# Patient Record
Sex: Female | Born: 1985 | Race: White | Marital: Single | Smoking: Never smoker
Health system: Southern US, Community
[De-identification: ages and names within clinical notes are randomized; demographics above are authoritative.]

## PROBLEM LIST (undated history)

## (undated) DIAGNOSIS — T7840XA Allergy, unspecified, initial encounter: Secondary | ICD-10-CM

## (undated) DIAGNOSIS — G43909 Migraine, unspecified, not intractable, without status migrainosus: Secondary | ICD-10-CM

## (undated) DIAGNOSIS — J45909 Unspecified asthma, uncomplicated: Secondary | ICD-10-CM

## (undated) HISTORY — DX: Migraine, unspecified, not intractable, without status migrainosus: G43.909

## (undated) HISTORY — DX: Allergy, unspecified, initial encounter: T78.40XA

## (undated) HISTORY — DX: Unspecified asthma, uncomplicated: J45.909

---

## 2015-03-04 ENCOUNTER — Ambulatory Visit (INDEPENDENT_AMBULATORY_CARE_PROVIDER_SITE_OTHER): Payer: 59 | Admitting: Physician Assistant

## 2015-03-04 ENCOUNTER — Encounter: Payer: Self-pay | Admitting: Physician Assistant

## 2015-03-04 VITALS — BP 110/70 | HR 85 | Temp 98.1°F | Resp 16 | Ht 67.5 in | Wt 150.0 lb

## 2015-03-04 DIAGNOSIS — J45909 Unspecified asthma, uncomplicated: Secondary | ICD-10-CM | POA: Insufficient documentation

## 2015-03-04 DIAGNOSIS — R5383 Other fatigue: Secondary | ICD-10-CM | POA: Diagnosis not present

## 2015-03-04 DIAGNOSIS — Z79899 Other long term (current) drug therapy: Secondary | ICD-10-CM

## 2015-03-04 DIAGNOSIS — F411 Generalized anxiety disorder: Secondary | ICD-10-CM | POA: Diagnosis not present

## 2015-03-04 DIAGNOSIS — E559 Vitamin D deficiency, unspecified: Secondary | ICD-10-CM | POA: Diagnosis not present

## 2015-03-04 DIAGNOSIS — G43909 Migraine, unspecified, not intractable, without status migrainosus: Secondary | ICD-10-CM | POA: Insufficient documentation

## 2015-03-04 DIAGNOSIS — R002 Palpitations: Secondary | ICD-10-CM

## 2015-03-04 DIAGNOSIS — T7840XA Allergy, unspecified, initial encounter: Secondary | ICD-10-CM | POA: Insufficient documentation

## 2015-03-04 LAB — HEPATIC FUNCTION PANEL
ALBUMIN: 4.9 g/dL (ref 3.6–5.1)
ALT: 19 U/L (ref 6–29)
AST: 20 U/L (ref 10–30)
Alkaline Phosphatase: 51 U/L (ref 33–115)
BILIRUBIN TOTAL: 0.7 mg/dL (ref 0.2–1.2)
Bilirubin, Direct: 0.1 mg/dL (ref <=0.2)
Indirect Bilirubin: 0.6 mg/dL (ref 0.2–1.2)
TOTAL PROTEIN: 7.1 g/dL (ref 6.1–8.1)

## 2015-03-04 LAB — CBC WITH DIFFERENTIAL/PLATELET
BASOS ABS: 0 10*3/uL (ref 0.0–0.1)
Basophils Relative: 0 % (ref 0–1)
EOS ABS: 0.3 10*3/uL (ref 0.0–0.7)
EOS PCT: 3 % (ref 0–5)
HEMATOCRIT: 42.6 % (ref 36.0–46.0)
Hemoglobin: 14.4 g/dL (ref 12.0–15.0)
LYMPHS ABS: 2.2 10*3/uL (ref 0.7–4.0)
LYMPHS PCT: 22 % (ref 12–46)
MCH: 29.9 pg (ref 26.0–34.0)
MCHC: 33.8 g/dL (ref 30.0–36.0)
MCV: 88.6 fL (ref 78.0–100.0)
MONO ABS: 0.5 10*3/uL (ref 0.1–1.0)
MPV: 11 fL (ref 8.6–12.4)
Monocytes Relative: 5 % (ref 3–12)
Neutro Abs: 6.9 10*3/uL (ref 1.7–7.7)
Neutrophils Relative %: 70 % (ref 43–77)
PLATELETS: 251 10*3/uL (ref 150–400)
RBC: 4.81 MIL/uL (ref 3.87–5.11)
RDW: 12.4 % (ref 11.5–15.5)
WBC: 9.9 10*3/uL (ref 4.0–10.5)

## 2015-03-04 LAB — BASIC METABOLIC PANEL WITH GFR
BUN: 9 mg/dL (ref 7–25)
CALCIUM: 9.7 mg/dL (ref 8.6–10.2)
CO2: 23 mmol/L (ref 20–31)
CREATININE: 0.69 mg/dL (ref 0.50–1.10)
Chloride: 105 mmol/L (ref 98–110)
GFR, Est Non African American: >89 mL/min (ref >=60)
GLUCOSE: 85 mg/dL (ref 65–99)
POTASSIUM: 3.9 mmol/L (ref 3.5–5.3)
Sodium: 138 mmol/L (ref 135–146)

## 2015-03-04 LAB — IRON AND TIBC
%SAT: 54 % — ABNORMAL HIGH (ref 11–50)
Iron: 212 ug/dL — ABNORMAL HIGH (ref 40–190)
TIBC: 391 ug/dL (ref 250–450)
UIBC: 179 ug/dL (ref 125–400)

## 2015-03-04 LAB — MAGNESIUM: MAGNESIUM: 2 mg/dL (ref 1.5–2.5)

## 2015-03-04 LAB — TSH: TSH: 0.467 u[IU]/mL (ref 0.350–4.500)

## 2015-03-04 LAB — VITAMIN B12: VITAMIN B 12: 496 pg/mL (ref 211–911)

## 2015-03-04 LAB — FERRITIN: FERRITIN: 32 ng/mL (ref 10–291)

## 2015-03-04 MED ORDER — VERAPAMIL HCL ER 120 MG PO TBCR: 120.0000 mg | EXTENDED_RELEASE_TABLET | Freq: Every day | ORAL | Status: DC

## 2015-03-04 NOTE — Progress Notes (Signed)
Assessment and Plan: 1. Palpitations Normal EKG, nonexertional Likely anxiety versus SVT, will check labs rule out anemia, thyroid, etc.  Taught valsalva, start verapamil 120mg  at night, may need to increase. Due to lower blood pressure will avoid BB at this time.  - EKG 12-Lead - TSH - Iron and TIBC - Ferritin  2. Generalized anxiety disorder Will start verapamil for now, discussed SSRIs however patient is not interested at this time.  Will do close follow up. No SI/HI.   3. Other fatigue Check labs, EKG normal, ? Anxiety/depression component.  - CBC with Differential/Platelet - BASIC METABOLIC PANEL WITH GFR - Hepatic function panel - Iron and TIBC - Ferritin - Vitamin B12  4. Medication management - Magnesium  5. Vitamin D deficiency - Vit D  25 hydroxy (rtn osteoporosis monitoring)    HPI 29 y.o.female presents as new patient, has not had insurance for several years, [facility]. From [place], works with as Sales executivedental assistant. She has asthma, gets albuterol from her mother in law for asthma.   She has always been shy, but recently in the last years she will get very anxious and will have palpitations, shortness of breath, voice is shakey, eyes watery and what is bothering her is that she will have shaking as a Sales executivedental assistant. She has some decreased concentration issues, has trouble sleeping due to fast heart beat. She will cry occasionally from being overwhelmed. She exercises, runs without CP, SOB, palpitations. Moved a year and a half ago, new job 3 months ago.   Never abnormal pap, last one April 2015. She has regular periods but has been getting shorter, not as heavy.   Past Medical History  Diagnosis Date  . Asthma   . Allergy   . Migraines      Allergies  Allergen Reactions  . Soy Allergy Swelling     No current outpatient prescriptions on file prior to visit.   No current facility-administered medications on file prior to visit.    Allergies Allergies  Allergen Reactions  . Soy Allergy Swelling   Surgical history History reviewed. No pertinent past surgical history. Family history History reviewed. No pertinent family history.  ROS: all negative except above.   Physical Exam: Filed Weights   03/04/15 1130  Weight: 150 lb (68.04 kg)   BP 110/70 mmHg  Pulse 85  Temp(Src) 98.1 F (36.7 C) (Temporal)  Resp 16  Ht 5' 7.5" (1.715 m)  Wt 150 lb (68.04 kg)  BMI 23.13 kg/m2  SpO2 99%  LMP 02/16/2015 General Appearance: Well nourished, in no apparent distress. Eyes: PERRLA, EOMs, conjunctiva no swelling or erythema Sinuses: No Frontal/maxillary tenderness ENT/Mouth: Ext aud canals clear, TMs without erythema, bulging. No erythema, swelling, or exudate on post pharynx.  Tonsils not swollen or erythematous. Hearing normal.  Neck: Supple, thyroid normal.  Respiratory: Respiratory effort normal, BS equal bilaterally without rales, rhonchi, wheezing or stridor.  Cardio: RRR with no MRGs. Brisk peripheral pulses without edema.  Abdomen: Soft, + BS.  Non tender, no guarding, rebound, hernias, masses. Lymphatics: Non tender without lymphadenopathy.  Musculoskeletal: Full ROM, 5/5 strength, normal gait.  Skin: Warm, dry without rashes, lesions, ecchymosis.  Neuro: Cranial nerves intact. Normal muscle tone, no cerebellar symptoms. Sensation intact.  Psych: Awake and oriented X 3, normal affect, Insight and Judgment appropriate.    EKG: WNL, no ST changes.   Quentin MullingAmanda Kalief Kattner, PA-C 12:07 PM [facility]

## 2015-03-04 NOTE — Patient Instructions (Addendum)
Palpitations A palpitation is the feeling that your heartbeat is irregular or is faster than normal. It may feel like your heart is fluttering or skipping a beat. Palpitations are usually not a serious problem. However, in some cases, you may need further medical evaluation. CAUSES  Palpitations can be caused by:  Smoking.  Caffeine or other stimulants, such as diet pills or energy drinks.  Alcohol.  Stress and anxiety.  Strenuous physical activity.  Fatigue.  Certain medicines.  Heart disease, especially if you have a history of irregular heart rhythms (arrhythmias), such as atrial fibrillation, atrial flutter, or supraventricular tachycardia.  An improperly working pacemaker or defibrillator. DIAGNOSIS  To find the cause of your palpitations, your health care provider will take your medical history and perform a physical exam. Your health care provider may also have you take a test called an ambulatory electrocardiogram (ECG). An ECG records your heartbeat patterns over a 24-hour period. You may also have other tests, such as:  Transthoracic echocardiogram (TTE). During echocardiography, sound waves are used to evaluate how blood flows through your heart.  Transesophageal echocardiogram (TEE).  Cardiac monitoring. This allows your health care provider to monitor your heart rate and rhythm in real time.  Holter monitor. This is a portable device that records your heartbeat and can help diagnose heart arrhythmias. It allows your health care provider to track your heart activity for several days, if needed.  Stress tests by exercise or by giving medicine that makes the heart beat faster. TREATMENT  Treatment of palpitations depends on the cause of your symptoms and can vary greatly. Most cases of palpitations do not require any treatment other than time, relaxation, and monitoring your symptoms. Other causes, such as atrial fibrillation, atrial flutter, or supraventricular  tachycardia, usually require further treatment. HOME CARE INSTRUCTIONS   Avoid:  Caffeinated coffee, tea, soft drinks, diet pills, and energy drinks.  Chocolate.  Alcohol.  Stop smoking if you smoke.  Reduce your stress and anxiety. Things that can help you relax include:  A method of controlling things in your body, such as your heartbeats, with your mind (biofeedback).  Yoga.  Meditation.  Physical activity such as swimming, jogging, or walking.  Get plenty of rest and sleep. SEEK MEDICAL CARE IF:   You continue to have a fast or irregular heartbeat beyond 24 hours.  Your palpitations occur more often. SEEK IMMEDIATE MEDICAL CARE IF:  You have chest pain or shortness of breath.  You have a severe headache.  You feel dizzy or you faint. MAKE SURE YOU:  Understand these instructions.  Will watch your condition.  Will get help right away if you are not doing well or get worse.   This information is not intended to replace advice given to you by your health care provider. Make sure you discuss any questions you have with your health care provider.   Document Released: 04/06/2000 Document Revised: 04/14/2013 Document Reviewed: 06/08/2011 Elsevier Interactive Patient Education [employer]2016 [employer]  Your ears and sinuses are connected by the eustachian tube. When your sinuses are inflamed, this can close off the tube and cause fluid to collect in your middle ear. This can then cause dizziness, popping, clicking, ringing, and echoing in your ears. This is often NOT an infection and does NOT require antibiotics, it is caused by inflammation so the treatments help the inflammation. This can take a long time to get better so please be patient.  Here are things you can do to help with  this: - Try the Flonase or Nasonex. Remember to spray each nostril twice towards the outer part of your eye.  Do not sniff but instead pinch your nose and tilt your head back to help the medicine  get into your sinuses.  The best time to do this is at bedtime.Stop if you get blurred vision or nose bleeds.  -While drinking fluids, pinch and hold nose close and swallow, to help open eustachian tubes to drain fluid behind ear drums. -Please pick one of the over the counter allergy medications below and take it once daily for allergies.  It will also help with fluid behind ear drums. Claritin or loratadine cheapest but likely the weakest  Zyrtec or certizine at night because it can make you sleepy The strongest is allegra or fexafinadine  Cheapest at [facility], [employer], [employer] -can use decongestant over the counter, please do not use if you have high blood pressure or certain heart conditions.   if worsening HA, changes vision/speech, imbalance, weakness go to the ER

## 2015-03-05 LAB — VITAMIN D 25 HYDROXY (VIT D DEFICIENCY, FRACTURES): VIT D 25 HYDROXY: 29 ng/mL — AB (ref 30–100)

## 2015-03-06 ENCOUNTER — Encounter: Payer: Self-pay | Admitting: Physician Assistant

## 2015-03-07 ENCOUNTER — Other Ambulatory Visit: Payer: Self-pay | Admitting: Physician Assistant

## 2015-03-07 ENCOUNTER — Encounter: Payer: Self-pay | Admitting: Physician Assistant

## 2015-03-07 MED ORDER — HEPATITIS B VAC RECOMBINANT 10 MCG/0.5ML IM INJ: 0.5000 mL | INJECTION | Freq: Once | INTRAMUSCULAR | Status: DC

## 2015-03-15 MED ORDER — HEPATITIS B VAC RECOMBINANT 10 MCG/0.5ML IM INJ: 0.5000 mL | INJECTION | Freq: Once | INTRAMUSCULAR | Status: DC

## 2015-04-01 ENCOUNTER — Ambulatory Visit: Payer: Self-pay | Admitting: Physician Assistant

## 2015-04-08 ENCOUNTER — Encounter: Payer: Self-pay | Admitting: Physician Assistant

## 2015-04-08 ENCOUNTER — Ambulatory Visit (INDEPENDENT_AMBULATORY_CARE_PROVIDER_SITE_OTHER): Payer: 59 | Admitting: Physician Assistant

## 2015-04-08 ENCOUNTER — Other Ambulatory Visit: Payer: Self-pay

## 2015-04-08 VITALS — BP 118/64 | HR 87 | Temp 97.9°F | Resp 16 | Ht 67.5 in | Wt 156.0 lb

## 2015-04-08 DIAGNOSIS — R002 Palpitations: Secondary | ICD-10-CM

## 2015-04-08 DIAGNOSIS — F411 Generalized anxiety disorder: Secondary | ICD-10-CM

## 2015-04-08 MED ORDER — ALPRAZOLAM 0.5 MG PO TABS: ORAL_TABLET | ORAL | Status: DC

## 2015-04-08 MED ORDER — SERTRALINE HCL 25 MG PO TABS: 25.0000 mg | ORAL_TABLET | Freq: Every day | ORAL | Status: DC

## 2015-04-08 NOTE — Patient Instructions (Signed)
Take zoloft at night, may increase to 50mg  at end of month or keep the same  Take the xanax AS NEEDED for panic attack, try 1/2-1 pill   Generalized Anxiety Disorder Generalized anxiety disorder (GAD) is a mental disorder. It interferes with life functions, including relationships, work, and school. GAD is different from normal anxiety, which everyone experiences at some point in their lives in response to specific life events and activities. Normal anxiety actually helps us prepare for and get through these life events and activities. Normal anxiety goes away after the event or activity is over.  GAD causes anxiety that is not necessarily related to specific events or activities. It also causes excess anxiety in proportion to specific events or activities. The anxiety associated with GAD is also difficult to control. GAD can vary from mild to severe. People with severe GAD can have intense waves of anxiety with physical symptoms (panic attacks).  SYMPTOMS The anxiety and worry associated with GAD are difficult to control. This anxiety and worry are related to many life events and activities and also occur more days than not for 6 months or longer. People with GAD also have three or more of the following symptoms (one or more in children):  Restlessness.   Fatigue.  Difficulty concentrating.   Irritability.  Muscle tension.  Difficulty sleeping or unsatisfying sleep. DIAGNOSIS GAD is diagnosed through an assessment by your health care provider. Your health care provider will ask you questions aboutyour mood,physical symptoms, and events in your life. Your health care provider may ask you about your medical history and use of alcohol or drugs, including prescription medicines. Your health care provider may also do a physical exam and blood tests. Certain medical conditions and the use of certain substances can cause symptoms similar to those associated with GAD. Your health care provider  may refer you to a mental health specialist for further evaluation. TREATMENT The following therapies are usually used to treat GAD:   Medication. Antidepressant medication usually is prescribed for long-term daily control. Antianxiety medicines may be added in severe cases, especially when panic attacks occur.   Talk therapy (psychotherapy). Certain types of talk therapy can be helpful in treating GAD by providing support, education, and guidance. A form of talk therapy called cognitive behavioral therapy can teach you healthy ways to think about and react to daily life events and activities.  Stress managementtechniques. These include yoga, meditation, and exercise and can be very helpful when they are practiced regularly. A mental health specialist can help determine which treatment is best for you. Some people see improvement with one therapy. However, other people require a combination of therapies.   This information is not intended to replace advice given to you by your health care provider. Make sure you discuss any questions you have with your health care provider.   Document Released: 08/04/2012 Document Revised: 04/30/2014 Document Reviewed: 08/04/2012 Elsevier Interactive Patient Education [employer] Elsevier Inc.

## 2015-04-08 NOTE — Progress Notes (Signed)
Assessment and Plan: Palpitations- valsalva has helped, could not tolerate verapamil, continue to decrease caffiene, and treat anxiety.  Anxiety/palpitations- willing to get on  low dose zoloft, and will do low dose xanax for the mean time for acute anxiety attacks.   Follow up 1 month.    HPI 29 y.o.female presents for follow up from last OV. She was put on verapamil for palpitations and tremors but states that she was very tired with it, felt bad and stopped it. She continues to have anxiety and some shaking at work with fast heart rate and nervousness. No CP, SOB, fever, chills, edema, SI/HI.    Past Medical History  Diagnosis Date  . Asthma   . Allergy   . Migraines      Allergies  Allergen Reactions  . Soy Allergy Swelling      Current Outpatient Prescriptions on File Prior to Visit  Medication Sig Dispense Refill  . ALBUTEROL IN Inhale into the lungs. As needed    . hepatitis B virus vaccine, recombinant, (ENGERIX-B) 10 MCG/0.5ML injection Inject 0.5 mLs into the muscle once. 0.5 mL 2  . verapamil (CALAN-SR) 120 MG CR tablet Take 1 tablet (120 mg total) by mouth at bedtime. 30 tablet 11   No current facility-administered medications on file prior to visit.    ROS: all negative except above.   Physical Exam: Filed Weights   04/08/15 1058  Weight: 156 lb (70.761 kg)   BP 118/64 mmHg  Pulse 87  Temp(Src) 97.9 F (36.6 C) (Temporal)  Resp 16  Ht 5' 7.5" (1.715 m)  Wt 156 lb (70.761 kg)  BMI 24.06 kg/m2  SpO2 99%  LMP 03/16/2015 General Appearance: Well nourished, in no apparent distress. Eyes: PERRLA, EOMs, conjunctiva no swelling or erythema Sinuses: No Frontal/maxillary tenderness ENT/Mouth: Ext aud canals clear, TMs without erythema, bulging. No erythema, swelling, or exudate on post pharynx.  Tonsils not swollen or erythematous. Hearing normal.  Neck: Supple, thyroid normal.  Respiratory: Respiratory effort normal, BS equal bilaterally without rales,  rhonchi, wheezing or stridor.  Cardio: RRR with no MRGs. Brisk peripheral pulses without edema.  Abdomen: Soft, + BS.  Non tender, no guarding, rebound, hernias, masses. Lymphatics: Non tender without lymphadenopathy.  Musculoskeletal: Full ROM, 5/5 strength, normal gait.  Skin: Warm, dry without rashes, lesions, ecchymosis.  Neuro: Cranial nerves intact. Normal muscle tone, no cerebellar symptoms. Sensation intact.  Psych: Awake and oriented X 3, normal affect, Insight and Judgment appropriate.     Quentin MullingAmanda Shalae Belmonte, PA-C 11:27 AM [facility]

## 2015-04-26 ENCOUNTER — Encounter: Payer: Self-pay | Admitting: Physician Assistant

## 2015-04-27 MED ORDER — DIAZEPAM 5 MG PO TABS: ORAL_TABLET | ORAL | Status: DC

## 2015-05-06 ENCOUNTER — Other Ambulatory Visit (HOSPITAL_COMMUNITY): Payer: Self-pay | Admitting: Obstetrics & Gynecology

## 2015-05-06 DIAGNOSIS — N979 Female infertility, unspecified: Secondary | ICD-10-CM

## 2015-05-12 ENCOUNTER — Ambulatory Visit (HOSPITAL_COMMUNITY): Payer: Self-pay

## 2015-05-13 ENCOUNTER — Ambulatory Visit (HOSPITAL_COMMUNITY)
Admission: RE | Admit: 2015-05-13 | Discharge: 2015-05-13 | Disposition: A | Discharge status: Home / Self Care | Payer: 59 | Source: Ambulatory Visit | Attending: Obstetrics & Gynecology | Admitting: Obstetrics & Gynecology

## 2015-05-13 DIAGNOSIS — N979 Female infertility, unspecified: Secondary | ICD-10-CM | POA: Insufficient documentation

## 2015-05-13 MED ORDER — IOHEXOL 300 MG/ML  SOLN
30.0000 mL | Freq: Once | INTRAMUSCULAR | Status: AC | PRN
  Administered 2015-05-13: 11 mL

## 2015-06-07 ENCOUNTER — Other Ambulatory Visit: Payer: Self-pay | Admitting: Physician Assistant

## 2015-06-07 ENCOUNTER — Encounter: Payer: Self-pay | Admitting: Physician Assistant

## 2015-06-07 MED ORDER — ALPRAZOLAM 0.5 MG PO TABS: ORAL_TABLET | ORAL | Status: DC

## 2015-06-07 NOTE — Progress Notes (Signed)
rx called into [facility]. 

## 2015-06-10 ENCOUNTER — Ambulatory Visit: Payer: Self-pay | Admitting: Physician Assistant

## 2015-06-24 ENCOUNTER — Other Ambulatory Visit: Payer: Self-pay | Admitting: Obstetrics & Gynecology

## 2015-06-24 ENCOUNTER — Other Ambulatory Visit (HOSPITAL_COMMUNITY)
Admission: RE | Admit: 2015-06-24 | Discharge: 2015-06-24 | Disposition: A | Discharge status: Home / Self Care | Payer: 59 | Source: Ambulatory Visit | Attending: Obstetrics & Gynecology | Admitting: Obstetrics & Gynecology

## 2015-06-24 DIAGNOSIS — Z01419 Encounter for gynecological examination (general) (routine) without abnormal findings: Secondary | ICD-10-CM | POA: Insufficient documentation

## 2015-06-24 DIAGNOSIS — Z113 Encounter for screening for infections with a predominantly sexual mode of transmission: Secondary | ICD-10-CM | POA: Insufficient documentation

## 2015-06-27 LAB — CYTOLOGY - PAP

## 2015-08-01 ENCOUNTER — Encounter: Payer: Self-pay | Admitting: Physician Assistant

## 2015-09-16 ENCOUNTER — Ambulatory Visit: Payer: Self-pay | Admitting: Physician Assistant

## 2015-10-04 ENCOUNTER — Other Ambulatory Visit: Payer: Self-pay | Admitting: Physician Assistant

## 2015-10-04 ENCOUNTER — Encounter: Payer: Self-pay | Admitting: Physician Assistant

## 2015-10-04 MED ORDER — SERTRALINE HCL 25 MG PO TABS: 25.0000 mg | ORAL_TABLET | Freq: Every day | ORAL | Status: AC

## 2015-10-21 ENCOUNTER — Encounter: Payer: Self-pay | Admitting: Internal Medicine

## 2015-10-21 ENCOUNTER — Ambulatory Visit (INDEPENDENT_AMBULATORY_CARE_PROVIDER_SITE_OTHER): Payer: 59 | Admitting: Internal Medicine

## 2015-10-21 DIAGNOSIS — J45909 Unspecified asthma, uncomplicated: Secondary | ICD-10-CM

## 2015-10-21 DIAGNOSIS — Z0184 Encounter for antibody response examination: Secondary | ICD-10-CM

## 2015-10-21 DIAGNOSIS — F411 Generalized anxiety disorder: Secondary | ICD-10-CM

## 2015-10-21 DIAGNOSIS — Z789 Other specified health status: Secondary | ICD-10-CM

## 2015-10-21 DIAGNOSIS — Z111 Encounter for screening for respiratory tuberculosis: Secondary | ICD-10-CM

## 2015-10-21 DIAGNOSIS — T7840XD Allergy, unspecified, subsequent encounter: Secondary | ICD-10-CM

## 2015-10-21 MED ORDER — ALPRAZOLAM 0.5 MG PO TABS: ORAL_TABLET | ORAL | Status: AC

## 2015-10-21 NOTE — Progress Notes (Signed)
Assessment and Plan:    1. Asthma, unspecified asthma severity, uncomplicated -poorly controlled -symbicort samples given -cont albuterol prn  2. Allergy, subsequent encounter -cont daily antihhistamine  3. Generalized anxiety disorder -xanax refilled -recommended either seeing therapy or SSRI for long term control -patient refused both SSRI and therapy  4. Screening for tuberculosis -patient to call office with results - PPD  5. Immune to varicella -had virus as a child -needed to go to school - Varicella zoster antibody, IgG  6. 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 Further disposition pending results of labs.  HPI 30 y.o. female  presents for 3 month follow up with allergies and generalized anxiety.  She reports that her allergies have been doing well. She reports that she is still using the zyrtec.  She does have to use albuterol every other day multiple times per day.  She reports that she has some asthma attacks.    She does have a history of generalized anxiety disorder.  She does not want to be on a daily medication.  She reports that she has tried zoloft.  She takes xanax and takes a 1/2 tablet or a whole tablet.  She reports that she wants to stay on the xanax and not take a daily medication.    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  Current Medications:  Current Outpatient Prescriptions on File Prior to Visit  Medication Sig Dispense Refill  . albuterol (PROVENTIL) 2 MG tablet Take by mouth.    . ALPRAZolam (XANAX) 0.5 MG tablet 1/2-1 tablet as needed twice daily for anxiety 30 tablet 1  . sertraline (ZOLOFT) 25 MG tablet Take 1 tablet (25 mg total) by mouth at bedtime. 30 tablet 3   No current facility-administered medications on file prior to visit.    Medical History:  Past Medical History  Diagnosis  Date  . Asthma   . Allergy   . Migraines     Allergies:  Allergies  Allergen Reactions  . Soy Allergy Swelling     Review of Systems:  ROS  Family history- Review and unchanged  Social history- Review and unchanged  Physical Exam: BP 122/64 mmHg  Pulse 76  Temp(Src) 98.2 F (36.8 C) (Temporal)  Resp 16  Ht 5\' 7"  (1.702 m)  Wt 156 lb (70.761 kg)  BMI 24.43 kg/m2  LMP 10/07/2015 Wt Readings from Last 3 Encounters:  10/21/15 156 lb (70.761 kg)  04/08/15 156 lb (70.761 kg)  03/04/15 150 lb (68.04 kg)    General Appearance: Well nourished well developed, in no apparent distress. Eyes: PERRLA, EOMs, conjunctiva no swelling or erythema ENT/Mouth: Ear canals normal without obstruction, swelling, erythma, discharge.  TMs normal bilaterally.  Oropharynx moist, clear, without exudate, or postoropharyngeal swelling. Neck: Supple, thyroid normal,no cervical adenopathy  Respiratory: Respiratory effort normal, Breath sounds clear A&P without rhonchi, wheeze, or rale.  No retractions, no accessory usage. Cardio: RRR with no MRGs. Brisk peripheral pulses without edema.  Abdomen: Soft, + BS,  Non tender, no guarding, rebound, hernias, masses. Musculoskeletal: Full ROM, 5/5 strength, Normal gait Skin: Warm, dry without rashes, lesions, ecchymosis.  Neuro: Awake and oriented X 3, Cranial nerves intact. Normal muscle tone, no cerebellar symptoms. Psych: Normal affect, Insight and Judgment appropriate.    Terri Piedraourtney Forcucci, PA-C 10:27 AM [facility]

## 2015-10-24 LAB — VARICELLA ZOSTER ANTIBODY, IGG: Varicella IgG: 553.6 Index — ABNORMAL HIGH (ref <135.00)

## 2015-10-24 LAB — HEPATITIS B E ANTIBODY: HEPATITIS BE ANTIBODY: NONREACTIVE

## 2016-07-31 IMAGING — RF DG HYSTEROGRAM
7 series · 7 of 7 positions shown · IV contrast (omnipaque)
Comparison: None.

CLINICAL DATA: Primary infertility.

EXAM:
HYSTEROSALPINGOGRAM
TECHNIQUE: Following cleansing of the cervix and vagina with Betadine solution,
a hysterosalpingogram was performed using a 5-French
hysterosalpingogram catheter and Omnipaque 300 contrast. The patient
tolerated the examination without difficulty.

[Series 1: run · 1 of 1 slices shown (1 of 7)]
[im 1/1]
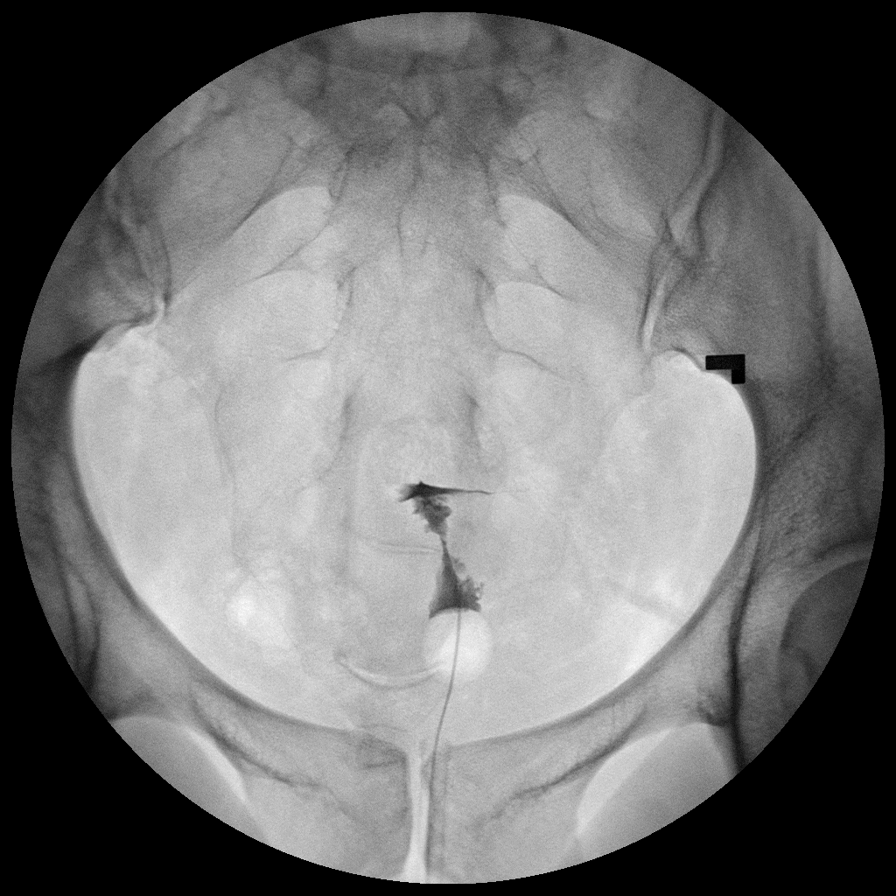

[Series 2: run · 1 of 1 slices shown (2 of 7)]
[im 1/1]
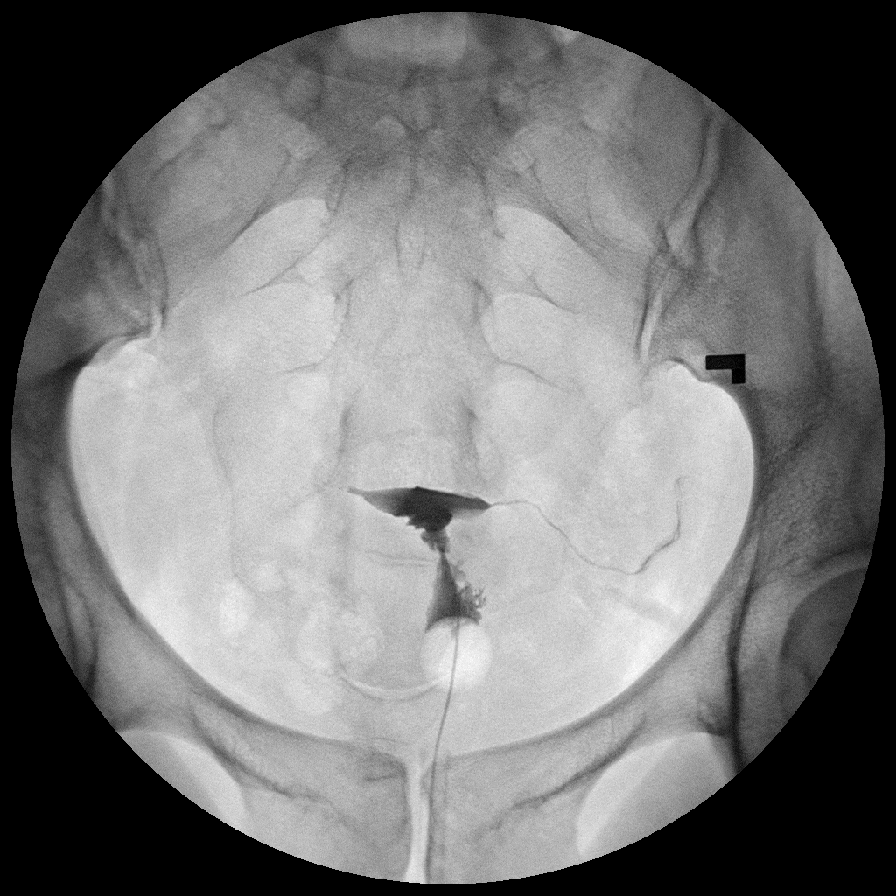

[Series 3: run · 1 of 1 slices shown (3 of 7)]
[im 1/1]
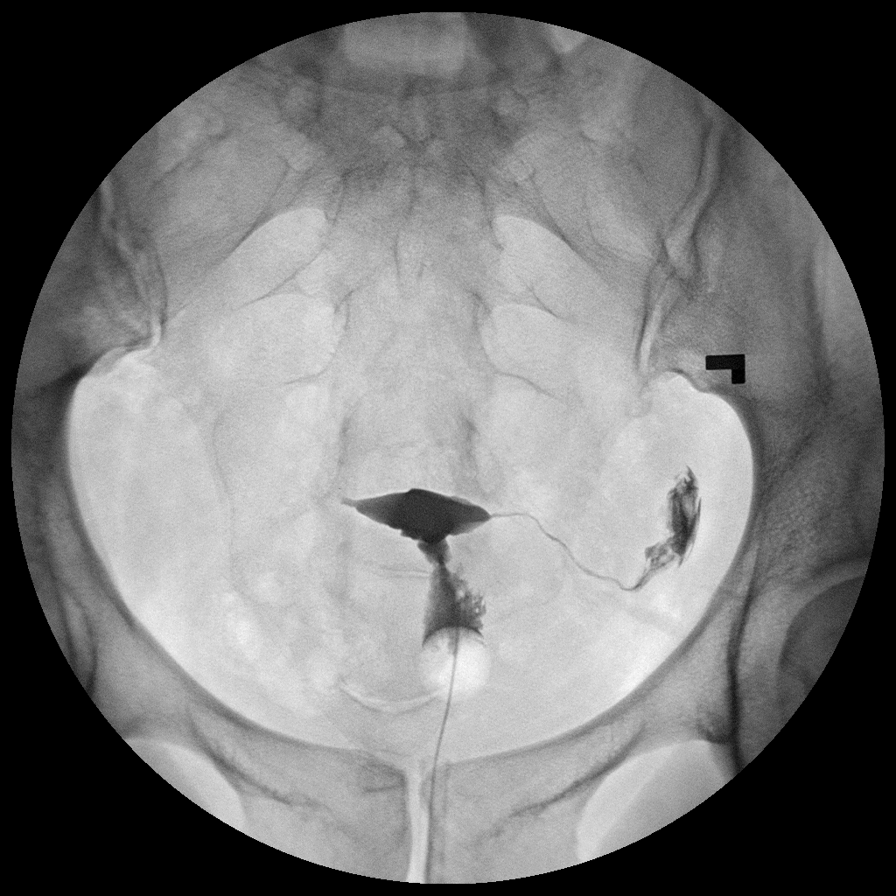

[Series 4: run · 1 of 1 slices shown (4 of 7)]
[im 1/1]
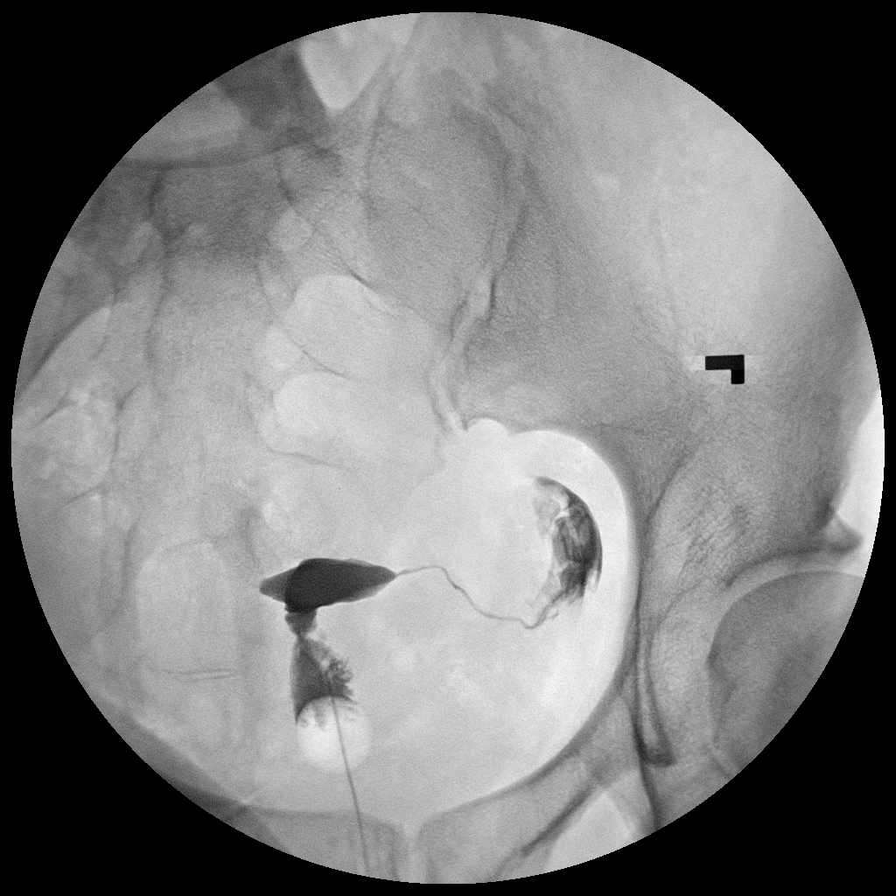

[Series 5: run · 1 of 1 slices shown (5 of 7)]
[im 1/1]
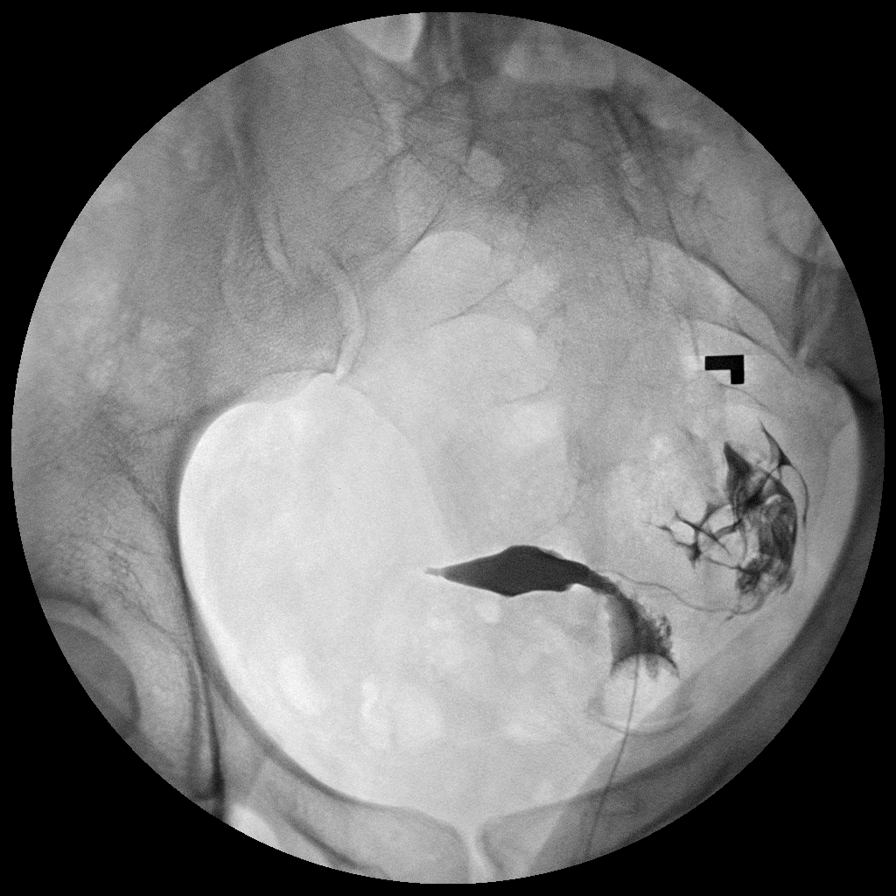

[Series 6: run · 1 of 1 slices shown (6 of 7)]
[im 1/1]
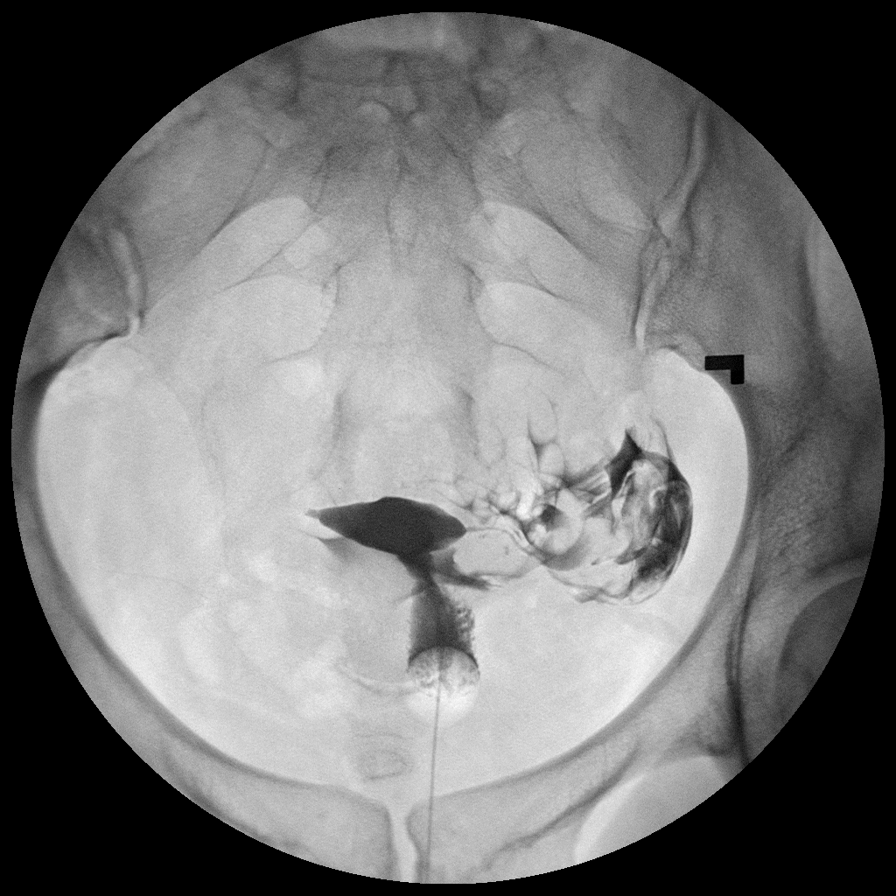

[Series 7: run · 1 of 1 slices shown (7 of 7)]
[im 1/1]
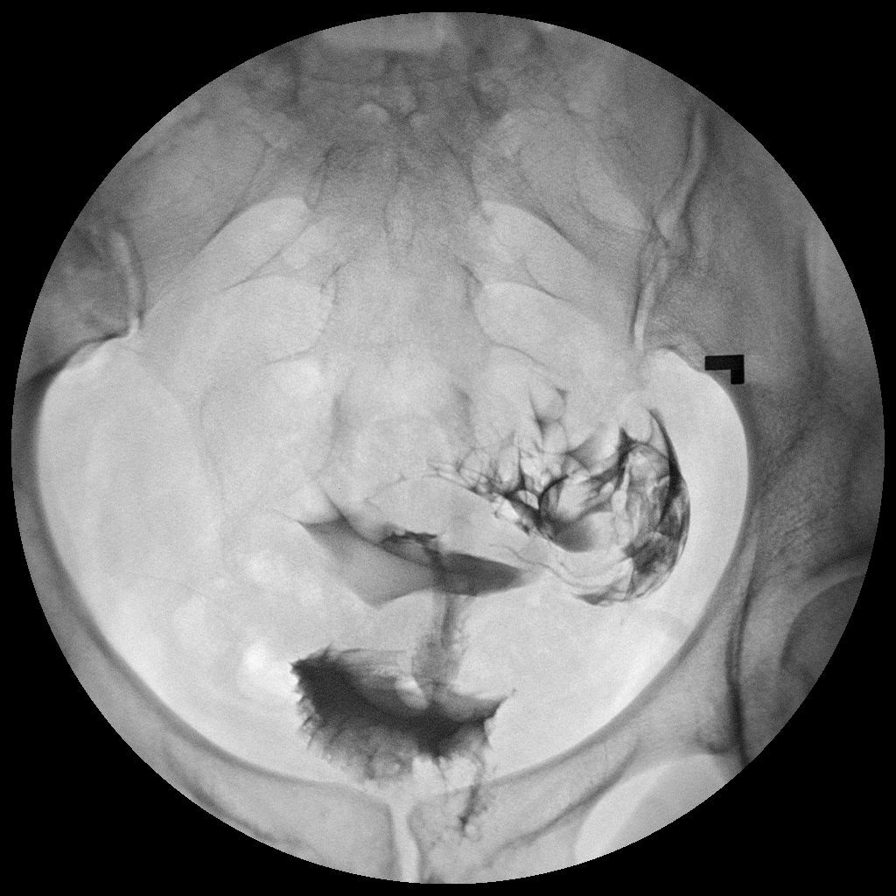

[7 of 7 positions shown; findings below may reference images not displayed]

FLUOROSCOPY TIME:  Fluoroscopy Time:  1 minutes 30 seconds

Number of Acquired Images:  7
FINDINGS: Endometrial Cavity: Normal appearance. No signs of Mullerian duct
anomaly or other significant abnormality.

Right Fallopian Tube: No contrast opacification seen, consistent
with proximal tubal occlusion.

Left Fallopian Tube: Well opacified and normal in appearance. Free
intraperitoneal spill of contrast is demonstrated.

Other:  None.
IMPRESSION: Left fallopian tube is patent. Proximal occlusion of right fallopian
tube.

Normal appearance of endometrial cavity.
